# Patient Record
Sex: Male | Born: 1935 | Race: White | Hispanic: No | Marital: Married
Health system: Southern US, Community
[De-identification: ages and names within clinical notes are randomized; demographics above are authoritative.]

---

## 2016-07-23 ENCOUNTER — Other Ambulatory Visit (HOSPITAL_COMMUNITY): Payer: Self-pay

## 2016-07-23 ENCOUNTER — Inpatient Hospital Stay
Admission: AD | Admit: 2016-07-23 | Discharge: 2016-08-19 | Disposition: A | Discharge status: Skilled Nursing Facility | Payer: Self-pay | Source: Ambulatory Visit | Attending: Internal Medicine | Admitting: Internal Medicine

## 2016-07-23 DIAGNOSIS — Z931 Gastrostomy status: Secondary | ICD-10-CM

## 2016-07-23 DIAGNOSIS — R1310 Dysphagia, unspecified: Secondary | ICD-10-CM

## 2016-07-23 DIAGNOSIS — T17908A Unspecified foreign body in respiratory tract, part unspecified causing other injury, initial encounter: Secondary | ICD-10-CM

## 2016-07-23 DIAGNOSIS — J690 Pneumonitis due to inhalation of food and vomit: Secondary | ICD-10-CM

## 2016-07-23 DIAGNOSIS — J189 Pneumonia, unspecified organism: Secondary | ICD-10-CM

## 2016-07-23 DIAGNOSIS — Z431 Encounter for attention to gastrostomy: Secondary | ICD-10-CM

## 2016-07-23 MED ORDER — IOPAMIDOL (ISOVUE-300) INJECTION 61%
INTRAVENOUS | Status: AC
  Filled 2016-07-23: qty 50

## 2016-07-24 ENCOUNTER — Other Ambulatory Visit (HOSPITAL_COMMUNITY): Payer: Self-pay

## 2016-07-24 LAB — COMPREHENSIVE METABOLIC PANEL
ALK PHOS: 77 U/L (ref 38–126)
ALT: 18 U/L (ref 17–63)
AST: 23 U/L (ref 15–41)
Albumin: 2.5 g/dL — ABNORMAL LOW (ref 3.5–5.0)
Anion gap: 6 (ref 5–15)
BILIRUBIN TOTAL: 1 mg/dL (ref 0.3–1.2)
BUN: 10 mg/dL (ref 6–20)
CALCIUM: 8.5 mg/dL — AB (ref 8.9–10.3)
CO2: 36 mmol/L — ABNORMAL HIGH (ref 22–32)
CREATININE: 0.48 mg/dL — AB (ref 0.61–1.24)
Chloride: 97 mmol/L — ABNORMAL LOW (ref 101–111)
GFR calc Af Amer: >60 mL/min (ref >60)
GLUCOSE: 135 mg/dL — AB (ref 65–99)
POTASSIUM: 3.2 mmol/L — AB (ref 3.5–5.1)
Sodium: 139 mmol/L (ref 135–145)
TOTAL PROTEIN: 6 g/dL — AB (ref 6.5–8.1)

## 2016-07-24 LAB — CBC
HCT: 39.5 % (ref 39.0–52.0)
Hemoglobin: 12.1 g/dL — ABNORMAL LOW (ref 13.0–17.0)
MCH: 30.6 pg (ref 26.0–34.0)
MCHC: 30.6 g/dL (ref 30.0–36.0)
MCV: 100 fL (ref 78.0–100.0)
PLATELETS: 208 10*3/uL (ref 150–400)
RBC: 3.95 MIL/uL — ABNORMAL LOW (ref 4.22–5.81)
RDW: 13.6 % (ref 11.5–15.5)
WBC: 11.2 10*3/uL — AB (ref 4.0–10.5)

## 2016-07-24 LAB — PROTIME-INR
INR: 1.17
Prothrombin Time: 15 seconds (ref 11.4–15.2)

## 2016-07-25 LAB — POTASSIUM: POTASSIUM: 4.1 mmol/L (ref 3.5–5.1)

## 2016-07-29 LAB — CBC WITH DIFFERENTIAL/PLATELET
BASOS ABS: 0 10*3/uL (ref 0.0–0.1)
Basophils Relative: 0 %
EOS ABS: 0.1 10*3/uL (ref 0.0–0.7)
EOS PCT: 1 %
HCT: 36.7 % — ABNORMAL LOW (ref 39.0–52.0)
Hemoglobin: 11.3 g/dL — ABNORMAL LOW (ref 13.0–17.0)
Lymphocytes Relative: 12 %
Lymphs Abs: 1.1 10*3/uL (ref 0.7–4.0)
MCH: 31.6 pg (ref 26.0–34.0)
MCHC: 30.8 g/dL (ref 30.0–36.0)
MCV: 102.5 fL — ABNORMAL HIGH (ref 78.0–100.0)
MONO ABS: 0.4 10*3/uL (ref 0.1–1.0)
Monocytes Relative: 4 %
Neutro Abs: 7.6 10*3/uL (ref 1.7–7.7)
Neutrophils Relative %: 83 %
PLATELETS: 197 10*3/uL (ref 150–400)
RBC: 3.58 MIL/uL — ABNORMAL LOW (ref 4.22–5.81)
RDW: 14.3 % (ref 11.5–15.5)
WBC: 9.2 10*3/uL (ref 4.0–10.5)

## 2016-07-29 LAB — PHOSPHORUS: Phosphorus: 2.5 mg/dL (ref 2.5–4.6)

## 2016-07-29 LAB — BASIC METABOLIC PANEL
ANION GAP: 7 (ref 5–15)
BUN: 24 mg/dL — AB (ref 6–20)
CALCIUM: 8.6 mg/dL — AB (ref 8.9–10.3)
CO2: 33 mmol/L — ABNORMAL HIGH (ref 22–32)
Chloride: 105 mmol/L (ref 101–111)
Creatinine, Ser: 0.55 mg/dL — ABNORMAL LOW (ref 0.61–1.24)
GFR calc Af Amer: >60 mL/min (ref >60)
GLUCOSE: 128 mg/dL — AB (ref 65–99)
POTASSIUM: 4 mmol/L (ref 3.5–5.1)
SODIUM: 145 mmol/L (ref 135–145)

## 2016-07-29 LAB — MAGNESIUM: MAGNESIUM: 2.3 mg/dL (ref 1.7–2.4)

## 2016-07-30 ENCOUNTER — Other Ambulatory Visit (HOSPITAL_COMMUNITY): Payer: Self-pay

## 2016-07-30 MED ORDER — LIDOCAINE VISCOUS 2 % MT SOLN
15.0000 mL | Freq: Once | OROMUCOSAL | Status: AC
  Administered 2016-07-30: 5 mL via OROMUCOSAL

## 2016-07-30 MED ORDER — IOPAMIDOL (ISOVUE-300) INJECTION 61%
50.0000 mL | Freq: Once | INTRAVENOUS | Status: AC | PRN
  Administered 2016-07-30: 20 mL

## 2016-07-31 ENCOUNTER — Other Ambulatory Visit (HOSPITAL_COMMUNITY): Payer: Self-pay

## 2016-08-02 ENCOUNTER — Other Ambulatory Visit (HOSPITAL_COMMUNITY): Payer: Self-pay

## 2016-08-02 NOTE — Consult Note (Signed)
Chief Complaint: Patient was seen in consultation today for percutaneous gastric tube placement at the request of Dr Ardeth Sportsman  Referring Physician(s): Dr Ardeth Sportsman  Supervising Physician: Gilmer Mor  Patient Status: [facility] - In-pt                                [facility]  History of Present Illness: Tyrone Flynn is a 83 y.o. male   Larey Seat at home--- suffered SAH/ICH Dementia Deconditioning Malnutrition  Has pulled out NG 3x Hx Afib---off coumadin since fall---early 07/2016 Will need long term care Request for percutaneous gastric tube placement Dr Loreta Ave has reviewed imaging---although reveals hernia- he feels still has an approachable window to stomach Scheduled for Gtube in IR 4/30  No past medical history on file.  No past surgical history on file.  Allergies: Patient has no allergy information on record.  Medications: Prior to Admission medications   Not on File     No family history on file.  Social History   Social History  . Marital status: Married    Spouse name: N/A  . Number of children: N/A  . Years of education: N/A   Social History Main Topics  . Smoking status: Not on file  . Smokeless tobacco: Not on file  . Alcohol use Not on file  . Drug use: Unknown  . Sexual activity: Not on file   Other Topics Concern  . Not on file   Social History Narrative  . No narrative on file    Review of Systems: A 12 point ROS discussed and pertinent positives are indicated in the HPI above.  All other systems are negative.  Review of Systems  Constitutional: Positive for activity change. Negative for appetite change, fatigue and fever.  Neurological: Positive for weakness.  Psychiatric/Behavioral: Positive for agitation and confusion.    Vital Signs: There were no vitals taken for this visit.  Physical Exam  Cardiovascular: Normal rate.   Pulmonary/Chest: Effort normal.  Abdominal: Soft.  Musculoskeletal: Normal range of motion.  Skin: Skin  is warm and dry.  Psychiatric:  Daughters at bedside Will get consent from pts wife when she comes in tonight  Nursing note and vitals reviewed.   Mallampati Score:  MD Evaluation Airway: WNL Heart: WNL Abdomen: WNL Chest/ Lungs: WNL ASA  Classification: 3 Mallampati/Airway Score: Two  Imaging: Ct Abdomen Wo Contrast  Result Date: 08/02/2016 CLINICAL DATA:  Gastrostomy tube evaluation EXAM: CT ABDOMEN AND PELVIS WITHOUT CONTRAST TECHNIQUE: Multidetector CT imaging of the abdomen and pelvis was performed following the standard protocol without IV contrast. COMPARISON:  None. FINDINGS: Lower chest: There is patchy consolidation at the posterior right lung base. The lower right hilum is prominent. Adenopathy is not excluded. Pacemaker device is in place. Leads are in the right atrium and right ventricle. Left main in 3 vessel coronary artery calcifications noted. Mild aortic valvular calcification. Hepatobiliary: Unremarkable Pancreas: Unremarkable Spleen: Unremarkable Adrenals/Urinary Tract: No hydronephrosis or obvious mass in the kidneys. Adrenal glands are unremarkable. Stomach/Bowel: The stomach is well positioned and opposed to the anterior abdominal wall. It is position well above the transverse colon. There is a large right sided abdominal hernia sac containing small and large bowel loops. Prominent stool burden throughout the colon. Diffuse colonic diverticulosis. Vascular/Lymphatic: Aortic, of visceral artery, and iliac artery vascular calcifications are noted. Circumaortic left renal vein anatomy. No abnormal retroperitoneal adenopathy. Other: No free-fluid Musculoskeletal: Multilevel degenerative  disc disease is visualized. No vertebral compression deformity. IMPRESSION: There is favorable anatomy for gastrostomy tube placement Large right-sided ventral abdominal hernia containing small and large bowel Patchy consolidation at the posterior right lung base and possible inferior right  hilar adenopathy. At the minimum, three-month follow-up CT scan is recommended to ensure resolution. Underlying malignancy cannot be excluded. Findings may represent aspiration pneumonia. Electronically Signed   By: Jolaine Click M.D.   On: 08/02/2016 07:39   Dg Abd 1 View  Result Date: 07/30/2016 CLINICAL DATA:  Feeding tube placement. EXAM: ABDOMEN - 1 VIEW FLUOROSCOPY TIME:  3 minutes, 18 seconds.  No images. COMPARISON:  None. FINDINGS: Feeding tube was placed with the tip left near the ligament of Treitz by radiology technologist Tara Dingus. IMPRESSION: Successful feeding tube placement. Electronically Signed   By: Drusilla Kanner M.D.   On: 07/30/2016 16:22   Dg Chest Port 1 View  Result Date: 07/31/2016 CLINICAL DATA:  Aspiration pneumonia EXAM: PORTABLE CHEST 1 VIEW COMPARISON:  07/24/2016 FINDINGS: The patient is rotated to the right limiting assessment. The thoracic aorta is at atherosclerotic and uncoiled in appearance. Left-sided pacemaker apparatus is noted with right atrial and right ventricular leads in place. Feeding tube has been removed since the prior exam. Minimal residual airspace disease at the right lung base which appears more like atelectasis on current exam. No effusion is identified. No pneumothorax is noted. IMPRESSION: Minimal atelectasis at the right lung base. No new pneumonic consolidation, CHF, effusion or pneumothorax. Electronically Signed   By: Tollie Eth M.D.   On: 07/31/2016 18:47   Dg Chest Port 1 View  Result Date: 07/24/2016 CLINICAL DATA:  Pneumonia EXAM: PORTABLE CHEST 1 VIEW COMPARISON:  None FINDINGS: Cardiac shadow is stable and accentuated by the rotatory nature of the patient. Pacing device is seen in satisfactory position. Right basilar infiltrate and small effusion is seen. No bony abnormality is noted. Feeding catheter is seen within the stomach. IMPRESSION: Right basilar infiltrate. Electronically Signed   By: Alcide Clever M.D.   On: 07/24/2016 07:55     Dg Abd Portable 1v  Result Date: 07/23/2016 CLINICAL DATA:  NG tube placement EXAM: PORTABLE ABDOMEN - 1 VIEW COMPARISON:  None in PACs FINDINGS: There is a radiodense tipped feeding tube which is coiled in the gastric cardia. The stomach does not appear distended with gas. There is a moderate amount of gas throughout the colon and there is some small bowel gas but the pattern does not appear obstructive. No free extraluminal gas collections are observed. IMPRESSION: The radiodense tipped feeding tube is coiled in the gastric cardia with the tip in the cardia as well. If the patient can tolerate being placed on his right side, advancement of the feeding tube through the stomach via peristalsis may be facilitated. Electronically Signed   By: David  [place] M.D.   On: 07/23/2016 16:53    Labs:  CBC:  Recent Labs  07/24/16 0522 07/29/16 0734  WBC 11.2* 9.2  HGB 12.1* 11.3*  HCT 39.5 36.7*  PLT 208 197    COAGS:  Recent Labs  07/24/16 0522  INR 1.17    BMP:  Recent Labs  07/24/16 0522 07/25/16 2103 07/29/16 0734  NA 139  --  145  K 3.2* 4.1 4.0  CL 97*  --  105  CO2 36*  --  33*  GLUCOSE 135*  --  128*  BUN 10  --  24*  CALCIUM 8.5*  --  8.6*  CREATININE 0.48*  --  0.55*  GFRNONAA >60  --  >60  GFRAA >60  --  >60    LIVER FUNCTION TESTS:  Recent Labs  07/24/16 0522  BILITOT 1.0  AST 23  ALT 18  ALKPHOS 77  PROT 6.0*  ALBUMIN 2.5*    TUMOR MARKERS: No results for input(s): AFPTM, CEA, CA199, CHROMGRNA in the last 8760 hours.  Assessment and Plan:  Intracranial hemorrhage from fall at home Dysphagia Deconditioning Dementia Need for long term care  Scheduled for percutaneous gastric tube placement 08/05/16 Wife to give consent when comes to room this pm Daughters at bedside Risks and Benefits discussed with the patient's daughters including, but not limited to the need for a barium enema during the procedure, bleeding, infection, peritonitis, or  damage to adjacent structures. All of their  questions were answered, daughters are agreeable to proceed; but will gain consent from wife.  Thank you for this interesting consult.  I greatly enjoyed meeting Tyrone Flynn and look forward to participating in their care.  A copy of this report was sent to the requesting provider on this date.  Electronically Signed: Ralene Muskrat A 08/02/2016, 11:46 AM   I spent a total of 40 Minutes    in face to face in clinical consultation, greater than 50% of which was counseling/coordinating care for percutaneous gastric tube placement

## 2016-08-04 ENCOUNTER — Other Ambulatory Visit (HOSPITAL_COMMUNITY): Payer: Self-pay

## 2016-08-04 LAB — BASIC METABOLIC PANEL
Anion gap: 6 (ref 5–15)
BUN: 15 mg/dL (ref 6–20)
CALCIUM: 8.6 mg/dL — AB (ref 8.9–10.3)
CO2: 31 mmol/L (ref 22–32)
CREATININE: 0.58 mg/dL — AB (ref 0.61–1.24)
Chloride: 109 mmol/L (ref 101–111)
GFR calc non Af Amer: >60 mL/min (ref >60)
GLUCOSE: 94 mg/dL (ref 65–99)
Potassium: 3.4 mmol/L — ABNORMAL LOW (ref 3.5–5.1)
Sodium: 146 mmol/L — ABNORMAL HIGH (ref 135–145)

## 2016-08-04 NOTE — Progress Notes (Signed)
Confirmed with RN, Morrie Sheldon that orders for NPO after midnight and hold blood thinners are in place for patient to be able to proceed with G-tube in IR department tomorrow (4/30).  Loyce Dys, MS RD PA-C

## 2016-08-05 ENCOUNTER — Other Ambulatory Visit (HOSPITAL_COMMUNITY): Payer: Self-pay

## 2016-08-05 LAB — CBC
HCT: 35.4 % — ABNORMAL LOW (ref 39.0–52.0)
HEMOGLOBIN: 10.7 g/dL — AB (ref 13.0–17.0)
MCH: 30.8 pg (ref 26.0–34.0)
MCHC: 30.2 g/dL (ref 30.0–36.0)
MCV: 102 fL — AB (ref 78.0–100.0)
PLATELETS: 169 10*3/uL (ref 150–400)
RBC: 3.47 MIL/uL — AB (ref 4.22–5.81)
RDW: 14.5 % (ref 11.5–15.5)
WBC: 8.6 10*3/uL (ref 4.0–10.5)

## 2016-08-05 LAB — PROTIME-INR
INR: 1.05
PROTHROMBIN TIME: 13.7 s (ref 11.4–15.2)

## 2016-08-05 LAB — APTT: APTT: 36 s (ref 24–36)

## 2016-08-05 NOTE — Progress Notes (Signed)
Patient ID: Tyrone Flynn, male   DOB: 1925-08-25, 83 y.o.   MRN: [id number]  ON HOLD---indefinitely Pt eating better  I cancelled order; re order if need to move forward

## 2016-08-06 ENCOUNTER — Encounter (HOSPITAL_COMMUNITY): Payer: Self-pay | Admitting: Interventional Radiology

## 2016-08-06 ENCOUNTER — Other Ambulatory Visit (HOSPITAL_COMMUNITY): Payer: Self-pay

## 2016-08-06 HISTORY — PX: IR GASTROSTOMY TUBE MOD SED: IMG625

## 2016-08-06 LAB — BASIC METABOLIC PANEL
ANION GAP: 7 (ref 5–15)
BUN: 9 mg/dL (ref 6–20)
CHLORIDE: 108 mmol/L (ref 101–111)
CO2: 30 mmol/L (ref 22–32)
Calcium: 8.6 mg/dL — ABNORMAL LOW (ref 8.9–10.3)
Creatinine, Ser: 0.47 mg/dL — ABNORMAL LOW (ref 0.61–1.24)
GFR calc Af Amer: >60 mL/min (ref >60)
GLUCOSE: 101 mg/dL — AB (ref 65–99)
POTASSIUM: 3.4 mmol/L — AB (ref 3.5–5.1)
Sodium: 145 mmol/L (ref 135–145)

## 2016-08-06 LAB — CBC
HEMATOCRIT: 38.4 % — AB (ref 39.0–52.0)
HEMOGLOBIN: 11.7 g/dL — AB (ref 13.0–17.0)
MCH: 30.9 pg (ref 26.0–34.0)
MCHC: 30.5 g/dL (ref 30.0–36.0)
MCV: 101.3 fL — AB (ref 78.0–100.0)
Platelets: 147 10*3/uL — ABNORMAL LOW (ref 150–400)
RBC: 3.79 MIL/uL — ABNORMAL LOW (ref 4.22–5.81)
RDW: 14.2 % (ref 11.5–15.5)
WBC: 5.5 10*3/uL (ref 4.0–10.5)

## 2016-08-06 LAB — PHOSPHORUS: PHOSPHORUS: 2.4 mg/dL — AB (ref 2.5–4.6)

## 2016-08-06 LAB — MAGNESIUM: Magnesium: 2 mg/dL (ref 1.7–2.4)

## 2016-08-06 MED ORDER — FENTANYL CITRATE (PF) 100 MCG/2ML IJ SOLN
INTRAMUSCULAR | Status: AC | PRN
  Administered 2016-08-06 (×4): 25 ug via INTRAVENOUS

## 2016-08-06 MED ORDER — LIDOCAINE HCL 1 % IJ SOLN
INTRAMUSCULAR | Status: AC
  Filled 2016-08-06: qty 20

## 2016-08-06 MED ORDER — CEFAZOLIN SODIUM-DEXTROSE 2-4 GM/100ML-% IV SOLN
INTRAVENOUS | Status: AC
  Filled 2016-08-06: qty 100

## 2016-08-06 MED ORDER — FENTANYL CITRATE (PF) 100 MCG/2ML IJ SOLN
INTRAMUSCULAR | Status: AC
  Filled 2016-08-06: qty 2

## 2016-08-06 MED ORDER — MIDAZOLAM HCL 2 MG/2ML IJ SOLN
INTRAMUSCULAR | Status: AC
  Filled 2016-08-06: qty 2

## 2016-08-06 MED ORDER — IOPAMIDOL (ISOVUE-300) INJECTION 61%
INTRAVENOUS | Status: AC
  Administered 2016-08-06: 20 mL
  Filled 2016-08-06: qty 50

## 2016-08-06 MED ORDER — MIDAZOLAM HCL 2 MG/2ML IJ SOLN
INTRAMUSCULAR | Status: AC | PRN
  Administered 2016-08-06: 1 mg via INTRAVENOUS
  Administered 2016-08-06 (×2): 0.5 mg via INTRAVENOUS

## 2016-08-06 MED ORDER — LIDOCAINE HCL 1 % IJ SOLN
INTRAMUSCULAR | Status: AC | PRN
  Administered 2016-08-06: 10 mL

## 2016-08-06 MED ORDER — CEFAZOLIN SODIUM-DEXTROSE 2-4 GM/100ML-% IV SOLN
2.0000 g | INTRAVENOUS | Status: AC
  Administered 2016-08-06: 2 g via INTRAVENOUS

## 2016-08-06 MED ORDER — GLUCAGON HCL RDNA (DIAGNOSTIC) 1 MG IJ SOLR
INTRAMUSCULAR | Status: AC
  Filled 2016-08-06: qty 1

## 2016-08-06 NOTE — Sedation Documentation (Signed)
Patient denies pain and is resting comfortably.  

## 2016-08-06 NOTE — Sedation Documentation (Signed)
pts unit tele monitor battery dead. Called [facility] twice requesting replacement battery. Did not arrive, at all. Procedure complete. Report given to Johnny Bridge, [place]. Johnny Bridge, RN gave permission to transport pt off tete, said" we are going to d/c tele anyway." Pt. Comfortable.

## 2016-08-06 NOTE — Sedation Documentation (Signed)
Pt. c/o procedural pain. 

## 2016-08-06 NOTE — Procedures (Signed)
Interventional Radiology Procedure Note  Procedure: Placement of percutaneous 20F pull-through gastrostomy tube. Complications: None Recommendations: - NPO except for sips and chips remainder of today and overnight - Maintain G-tube to LWS until tomorrow morning  - May advance diet as tolerated and begin using tube tomorrow morning  Signed,   Houa Ackert S. Tonette Koehne, DO   

## 2016-08-07 LAB — BASIC METABOLIC PANEL
ANION GAP: 5 (ref 5–15)
BUN: 7 mg/dL (ref 6–20)
CALCIUM: 8.8 mg/dL — AB (ref 8.9–10.3)
CO2: 35 mmol/L — ABNORMAL HIGH (ref 22–32)
Chloride: 105 mmol/L (ref 101–111)
Creatinine, Ser: 0.55 mg/dL — ABNORMAL LOW (ref 0.61–1.24)
Glucose, Bld: 105 mg/dL — ABNORMAL HIGH (ref 65–99)
Potassium: 4.2 mmol/L (ref 3.5–5.1)
Sodium: 145 mmol/L (ref 135–145)

## 2016-08-07 LAB — MAGNESIUM: MAGNESIUM: 2.1 mg/dL (ref 1.7–2.4)

## 2016-08-07 LAB — PHOSPHORUS: PHOSPHORUS: 2.7 mg/dL (ref 2.5–4.6)

## 2016-08-12 LAB — BASIC METABOLIC PANEL
ANION GAP: 4 — AB (ref 5–15)
BUN: 17 mg/dL (ref 6–20)
CHLORIDE: 97 mmol/L — AB (ref 101–111)
CO2: 40 mmol/L — ABNORMAL HIGH (ref 22–32)
Calcium: 8.7 mg/dL — ABNORMAL LOW (ref 8.9–10.3)
Creatinine, Ser: 0.49 mg/dL — ABNORMAL LOW (ref 0.61–1.24)
GFR calc non Af Amer: >60 mL/min (ref >60)
Glucose, Bld: 125 mg/dL — ABNORMAL HIGH (ref 65–99)
POTASSIUM: 3.9 mmol/L (ref 3.5–5.1)
SODIUM: 141 mmol/L (ref 135–145)

## 2016-08-13 LAB — CBC
HEMATOCRIT: 36.5 % — AB (ref 39.0–52.0)
HEMOGLOBIN: 11.1 g/dL — AB (ref 13.0–17.0)
MCH: 30.7 pg (ref 26.0–34.0)
MCHC: 30.4 g/dL (ref 30.0–36.0)
MCV: 101.1 fL — AB (ref 78.0–100.0)
Platelets: 135 10*3/uL — ABNORMAL LOW (ref 150–400)
RBC: 3.61 MIL/uL — AB (ref 4.22–5.81)
RDW: 14.5 % (ref 11.5–15.5)
WBC: 7.1 10*3/uL (ref 4.0–10.5)

## 2016-08-13 LAB — PHOSPHORUS: PHOSPHORUS: 3 mg/dL (ref 2.5–4.6)

## 2016-08-13 LAB — BASIC METABOLIC PANEL
ANION GAP: 7 (ref 5–15)
BUN: 17 mg/dL (ref 6–20)
CHLORIDE: 95 mmol/L — AB (ref 101–111)
CO2: 37 mmol/L — ABNORMAL HIGH (ref 22–32)
Calcium: 8.7 mg/dL — ABNORMAL LOW (ref 8.9–10.3)
Creatinine, Ser: 0.39 mg/dL — ABNORMAL LOW (ref 0.61–1.24)
Glucose, Bld: 111 mg/dL — ABNORMAL HIGH (ref 65–99)
POTASSIUM: 4.5 mmol/L (ref 3.5–5.1)
Sodium: 139 mmol/L (ref 135–145)

## 2016-08-13 LAB — MAGNESIUM: Magnesium: 2 mg/dL (ref 1.7–2.4)

## 2016-08-17 LAB — BASIC METABOLIC PANEL
ANION GAP: 8 (ref 5–15)
BUN: 19 mg/dL (ref 6–20)
CALCIUM: 8.6 mg/dL — AB (ref 8.9–10.3)
CHLORIDE: 96 mmol/L — AB (ref 101–111)
CO2: 32 mmol/L (ref 22–32)
CREATININE: 0.34 mg/dL — AB (ref 0.61–1.24)
GFR calc Af Amer: >60 mL/min (ref >60)
GFR calc non Af Amer: >60 mL/min (ref >60)
GLUCOSE: 119 mg/dL — AB (ref 65–99)
Potassium: 4.2 mmol/L (ref 3.5–5.1)
Sodium: 136 mmol/L (ref 135–145)

## 2016-08-17 LAB — MAGNESIUM: Magnesium: 1.9 mg/dL (ref 1.7–2.4)

## 2016-08-18 LAB — CK: Total CK: 25 U/L — ABNORMAL LOW (ref 49–397)

## 2017-08-16 IMAGING — RF DG SWALLOWING FUNCTION - NRPT [facility]
8 series · 24 of 24 positions shown · non-contrast
Comparison: none

[Series 2: cp_standard · 0.53mm/px · 3 of 602 frames shown (1 of 8)]
[frame 91/602]
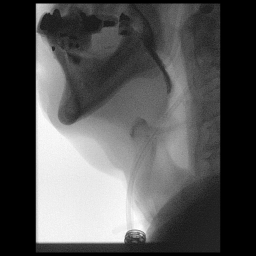
[frame 302/602]
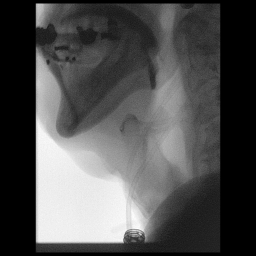
[frame 512/602]
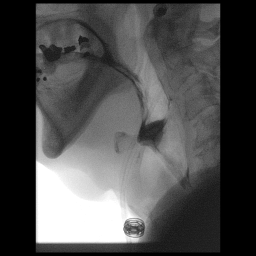

[Series 3: cp_standard · 0.53mm/px · 3 of 235 frames shown (2 of 8)]
[frame 36/235]
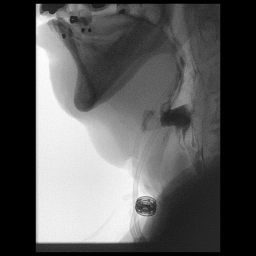
[frame 71/235]
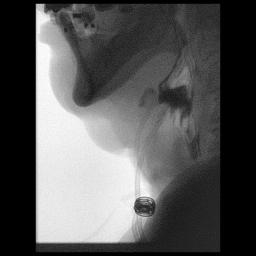
[frame 200/235]
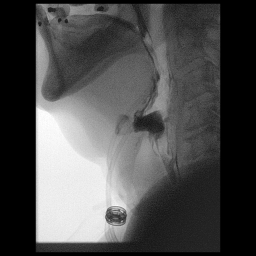

[Series 4: cp_standard · 0.53mm/px · 3 of 497 frames shown (3 of 8)]
[frame 47/497]
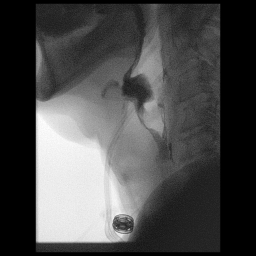
[frame 75/497]
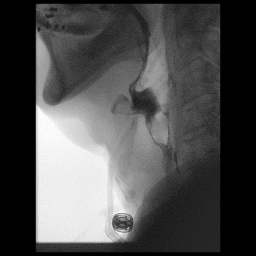
[frame 423/497]
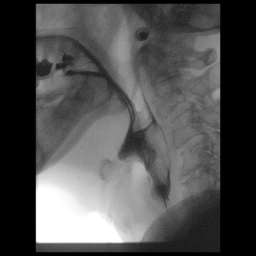

[Series 5: cp_standard · 0.53mm/px · 3 of 376 frames shown (4 of 8)]
[frame 57/376]
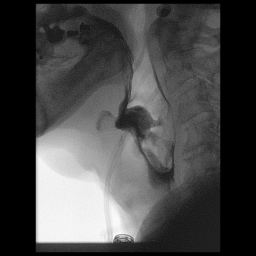
[frame 189/376]
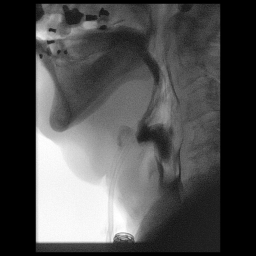
[frame 370/376]
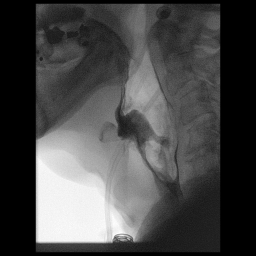

[Series 6: cp_standard · 0.53mm/px · 3 of 330 frames shown (5 of 8)]
[frame 50/330]
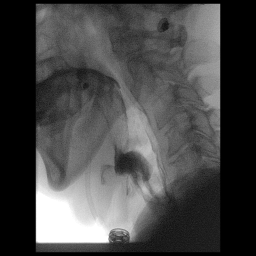
[frame 166/330]
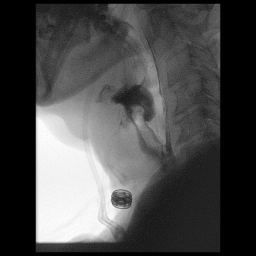
[frame 281/330]
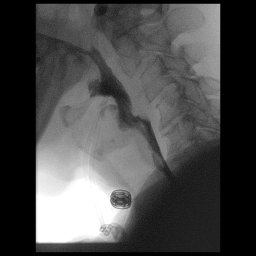

[Series 7: cp_standard · 0.53mm/px · 3 of 783 frames shown (6 of 8)]
[frame 118/783]
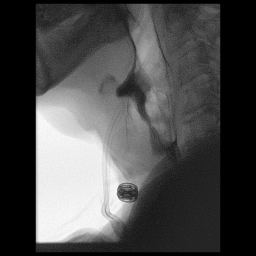
[frame 666/783]
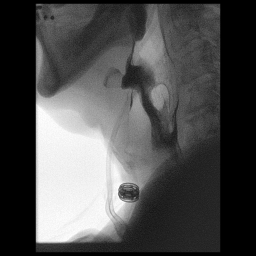
[frame 718/783]
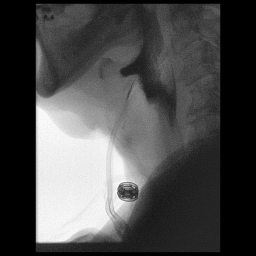

[Series 8: cp_standard · 0.53mm/px · 3 of 71 frames shown (7 of 8)]
[frame 11/71]
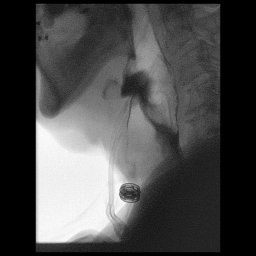
[frame 57/71]
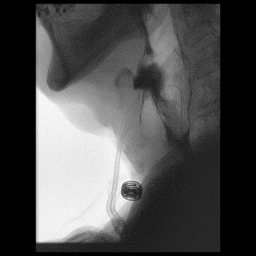
[frame 61/71]
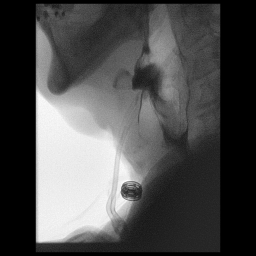

[Series 9: cp_standard · 0.53mm/px · 3 of 397 frames shown (8 of 8)]
[frame 11/397]
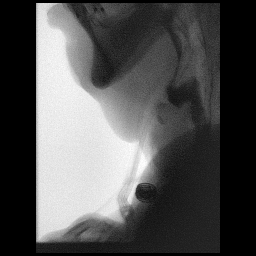
[frame 199/397]
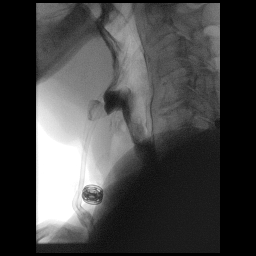
[frame 338/397]
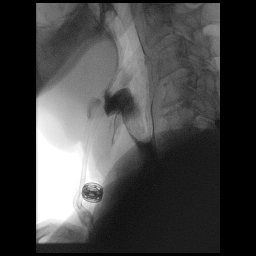

[24 of 24 positions shown; findings below may reference images not displayed]

FLUOROSCOPY FOR SWALLOWING FUNCTION STUDY:
Fluoroscopy was provided for swallowing function study, which was administered by a speech pathologist.  Final results and recommendations from this study are contained within the speech pathology report.
